# Patient Record
Sex: Male | Born: 2000 | Race: Black or African American | Hispanic: No | Marital: Single
Health system: Southern US, Community
[De-identification: ages and names within clinical notes are randomized; demographics above are authoritative.]

## PROBLEM LIST (undated history)

## (undated) DIAGNOSIS — F32A Depression, unspecified: Secondary | ICD-10-CM

## (undated) DIAGNOSIS — T7840XA Allergy, unspecified, initial encounter: Secondary | ICD-10-CM

## (undated) DIAGNOSIS — R51 Headache: Secondary | ICD-10-CM

## (undated) DIAGNOSIS — R269 Unspecified abnormalities of gait and mobility: Secondary | ICD-10-CM

## (undated) DIAGNOSIS — G809 Cerebral palsy, unspecified: Secondary | ICD-10-CM

## (undated) DIAGNOSIS — F419 Anxiety disorder, unspecified: Secondary | ICD-10-CM

## (undated) HISTORY — DX: Anxiety disorder, unspecified: F41.9

## (undated) HISTORY — DX: Headache: R51

## (undated) HISTORY — PX: CIRCUMCISION: SUR203

## (undated) HISTORY — PX: EYE SURGERY: SHX253

## (undated) HISTORY — DX: Allergy, unspecified, initial encounter: T78.40XA

## (undated) HISTORY — DX: Depression, unspecified: F32.A

## (undated) HISTORY — DX: Unspecified abnormalities of gait and mobility: R26.9

---
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
## 2018-02-02 ENCOUNTER — Ambulatory Visit (INDEPENDENT_AMBULATORY_CARE_PROVIDER_SITE_OTHER): Payer: Medicaid Other | Admitting: Pediatrics

## 2018-02-02 ENCOUNTER — Encounter (INDEPENDENT_AMBULATORY_CARE_PROVIDER_SITE_OTHER): Payer: Self-pay | Admitting: Pediatrics

## 2018-02-02 VITALS — BP 112/84 | HR 68 | Ht 69.0 in | Wt 298.4 lb

## 2018-02-02 DIAGNOSIS — G808 Other cerebral palsy: Secondary | ICD-10-CM

## 2018-02-02 DIAGNOSIS — H53001 Unspecified amblyopia, right eye: Secondary | ICD-10-CM | POA: Diagnosis not present

## 2018-02-02 DIAGNOSIS — G44219 Episodic tension-type headache, not intractable: Secondary | ICD-10-CM

## 2018-02-02 DIAGNOSIS — F819 Developmental disorder of scholastic skills, unspecified: Secondary | ICD-10-CM

## 2018-02-02 NOTE — Progress Notes (Signed)
Well  Patient: Charles Bass MRN: [id number] Sex: male DOB: 23-Sep-2000  Provider: Ellison Carwin, MD Location of Care: [facility]  Note type: Routine return visit  History of Present Illness: Referral Source: Dr. Jolaine Click History from: mother, patient and [facility] chart Chief Complaint: Headaches/Cerebral Palsy  Charles Bass is a 17 y.o. male who was evaluated on Feb 02, 2018 for the first time since August 19, 2016.  Clarkson came today with his mother with a number of concerns.  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  He has a history of frequent headaches.  His mother had concerns about memory loss in December 2017.  At that time, he was in the 11th grade at [employer] taking regular classes and hopes to go to [employer].  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  There is some entrance examination that he has to take.  [name] believes that he is having problems with short and long-term memory loss, which is frightening and annoying him.  Mother requested that I provide a letter to the school asking for accommodations.    I told her that it was not that easy.  I would need to have data from the last time he had psychologic testing for school and that might be old enough that it would be rejected.    Mother says that he is having staring spells, but when I asked if he was unresponsive, she said that as soon as she gets in his face, he makes eye contact, which suggest to me that he is not having seizures.  He is also having fairly frequent headaches that seem nondescript and more tension-type in nature than not.  Mother wanted to have an MRI scan to evaluate all these  issues.  Review of Systems: A complete review of systems was remarkable for patient is experiencing an increase in memeory loss, he has headaches every day, needs a letter of accomodation for school, possible MRI, all other systems reviewed and negative.  Past Medical History Diagnosis Date  . CP (cerebral palsy) (HCC)   . Headache(784.0)    Hospitalizations: No., Head Injury: No., Nervous System Infections: No., Immunizations up to date: Yes.    Birth History 2 lbs. 9 oz. infant born at [redacted] weeks gestational age to a 19 year old g 5 p 3 0 1 3 male. Gestation was complicated by intra-uterine growth retardation Mother received Epidural anesthesia  primary cesarean section Nursery Course was complicated by prolonged hospitalization due to prematurity Growth and Development was recalled as global delays  Behavior History self-injurious behavior, withdrawal  Surgical History Procedure Laterality Date  . CIRCUMCISION  2000  . EYE SURGERY  2000   Several eye surgeries when he was an infant   Family History family history includes Cancer in his maternal grandmother; Heart attack in his maternal grandfather. Family history is negative for migraines, seizures, intellectual disabilities, blindness, deafness, birth defects, chromosomal disorder, or autism.  Social History Socioeconomic History  . Marital status: Single  . Years of education: 58  . Highest education level: High School  Occupational History  . Unemployed  Social Needs  . Financial resource strain: Not on file  . Food insecurity:    Worry:  Not on file    Inability: Not on file  . Transportation needs:    Medical: Not on file    Non-medical: Not on file  Tobacco Use  . Smoking status: Passive Smoke Exposure - Never Smoker  . Smokeless tobacco: Never Used  Substance and Sexual Activity  . Alcohol use: No    Alcohol/week: 0.0 oz  . Drug use: No  . 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 He has four siblings.     He enjoys rapping, school and computer.   No Known Allergies  Physical Exam BP 112/84   Pulse 68   Ht  (1.753 m)   Wt 298 lb 6.4 oz (135.4 kg)   BMI 44.07 kg/m   General: alert, well developed, morbidly obese, in no acute distress, black hair, brown eyes, left handed Head: normocephalic, no dysmorphic features Ears, Nose and Throat: Otoscopic: tympanic membranes normal; pharynx: oropharynx is pink without exudates or tonsillar hypertrophy Neck: supple, full range of motion, no cranial or cervical bruits Respiratory: auscultation clear Cardiovascular: no murmurs, pulses are normal Musculoskeletal: no skeletal deformities or apparent scoliosis Skin: no rashes or neurocutaneous lesions  Neurologic Exam  Mental Status: alert; oriented to person, place and year; knowledge is normal for age; language is normal; Mini-Mental status examination 30/30, clock drawing 5/5, animal naming 17 in 1 minute all of which are normal Cranial Nerves: visual fields are full to double simultaneous stimuli; extraocular movements are full and conjugate; pupils are round reactive to light; funduscopic examination shows sharp disc margins with normal vessels; symmetric facial strength; midline tongue and uvula; air conduction is greater than bone conduction bilaterally Motor: Normal strength, tone and mass; good fine motor movements; no pronator drift Sensory: intact responses to cold, vibration, proprioception and stereognosis Coordination: good finger-to-nose, rapid repetitive alternating movements and finger apposition Gait and Station: shuffling, broad-based gait and station: patient is not able to walk on heels, walks on his toes and tandem difficulty; balance is fair; Romberg exam is negative Reflexes: symmetric and diminished bilaterally; no clonus;  bilateral flexor plantar responses  Assessment 1. 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 2. Amblyopia, right eye, H53.001. 3. Problems with learning, F81.9. 4. Morbid obesity, E66.01. 5. Episodic tension-type headache, not intractable, G44.219.  Discussion I assessed Braylen carefully, not only general examination but performed a detailed mini-mental status examination, he did extremely well.  He scored 30/30, accurately drew a clock face and put the proper time in it and was able to name 17 animals in 60 seconds, which is average.  I see no evidence of cognitive deterioration in this young man.  I believe that he has struggled in school previously and that this is not a new problem based on my notes a year and a half ago.  He and his mother live in [place], but they commute back and forth between [place] and [place] because there is family here.  He graduated from [employer] in 2018.  Recently because of pain in his legs, he has been given hydrocodone which he takes about once a day, rarely more often.  He also takes baclofen and a muscle relaxer.  I do not have the information about these medications.  Plan I asked him to keep a daily prospective headache calendar.  I reassured him about his cognitive capabilities based  on his normal mental status exam.  I think that college is going to be difficult for him.  He needed some modifications when he was at [employer].  I see no reason to perform an MRI scan at this time based on a normal examination and normal mental status exam.  He seems otherwise stable to me today.  I counseled Dozier and his mother that school is going to be somewhat difficult for him.  I told them that if they provided the request for a letter and any information that they could concern in previous testing that he had, that I would be happy to help secure additional assistance at school.  I emphasized to mother more than once that I needed to have IQ and achievement  testing scores and that they needed to be most recent.  I asked mother to bring in his last IEP that will help me understand how he was doing when he graduated from [employer] over a year ago.  I spent 40 minutes of face-to-face time with Tranquilino and his mother, more than half of it in consultation, discussing her concerns over his cognitive abilities, his staring spells, his headaches, and performing a detailed physical and mental status examination.  Doing exactly what I wanted to feel like you are living is being very engaged   Medication List    Accurate as of 02/02/18 11:59 PM.      cetirizine 10 MG tablet Commonly known as:  ZYRTEC Reported on 01/13/2016   doxycycline 100 MG capsule Commonly known as:  VIBRAMYCIN Take 1 capsule (100 mg total) by mouth 2 (two) times daily.   HYDROcodone-acetaminophen 5-325 MG tablet Commonly known as:  NORCO/VICODIN Take 1 tablet by mouth every 12 (twelve) hours as needed. for pain    1 to you make the storm in [place] right now about the medication list was reviewed and reconciled. All changes or newly prescribed medications were explained.  A complete medication list was provided to the patient/caregiver.  Deetta Perla MD

## 2018-02-02 NOTE — Patient Instructions (Addendum)
Chest is has had problems with learning for some time.  Going to college is going to be an extra stressful situation.  I will get him over carefully today and I do not find any problems with his memory, attention span, recall, or language.  I will be happy to write a letter on his behalf to request accommodations but I have to have some detailed information about his IQ and achievement testing.  I do not know when this was last done.  Please get the paper to me that request this letter.  Also get to me his last IEP.  I will do what I can.  I will tell you if I am not able to do a good job.  I do not think that he needs an MRI scan based on my assessment today.

## 2018-04-13 ENCOUNTER — Telehealth (INDEPENDENT_AMBULATORY_CARE_PROVIDER_SITE_OTHER): Payer: Self-pay | Admitting: Pediatrics

## 2018-04-13 NOTE — Telephone Encounter (Signed)
°  Who's calling (name and relationship to patient) : Mom/Sangria  Best contact number:  Hm # [phone] or mobile # [phone]  Provider they see: Dr Sharene SkeansHickling  Reason for call: Mom called in requesting a call back, stated that pt needs to have an adult IQ  psychological test and she is not sure where he could have it done; she would like for Dr Sharene SkeansHickling to recommend a place where he may be able to have it done since he will be needing it in order to continue to receive SSI.

## 2018-04-14 NOTE — Telephone Encounter (Signed)
I called patient's mother and lvm letting her know that we had received her message and would give her a call back once we receive recommendations from Dr. Sharene SkeansHickling.

## 2018-04-15 NOTE — Telephone Encounter (Signed)
I called patient's mother and let her know of Dr. Darl HouseholderHickling's message. Mother verbalized understanding and would like Dr. Sharene SkeansHickling to call her if he finds someone that may be able to see [facility].

## 2018-04-15 NOTE — Telephone Encounter (Signed)
Trying to find an adult psychologist who takes Medicaid is very difficult.  I am not certain where to start.  May have to start with the system psychologist and find out what other practitioners in the community see patients on Medicaid.  I do not think that any of the [facility] psychologists do

## 2018-05-02 NOTE — Telephone Encounter (Signed)
Please mail or fax this to home.

## 2018-05-02 NOTE — Telephone Encounter (Signed)
Spoke with mom to see how should would like to receive the list. She wanted me to mail it to the address on file. It has been placed up front to be mailed

## 2018-06-23 IMAGING — DX DG ABDOMEN 1V
2 series · 2 of 2 positions shown · non-contrast
Comparison: No recent prior.

CLINICAL DATA: Abdominal pain.

EXAM:
ABDOMEN - 1 VIEW

[dg abd 1 view (1 of 2)]
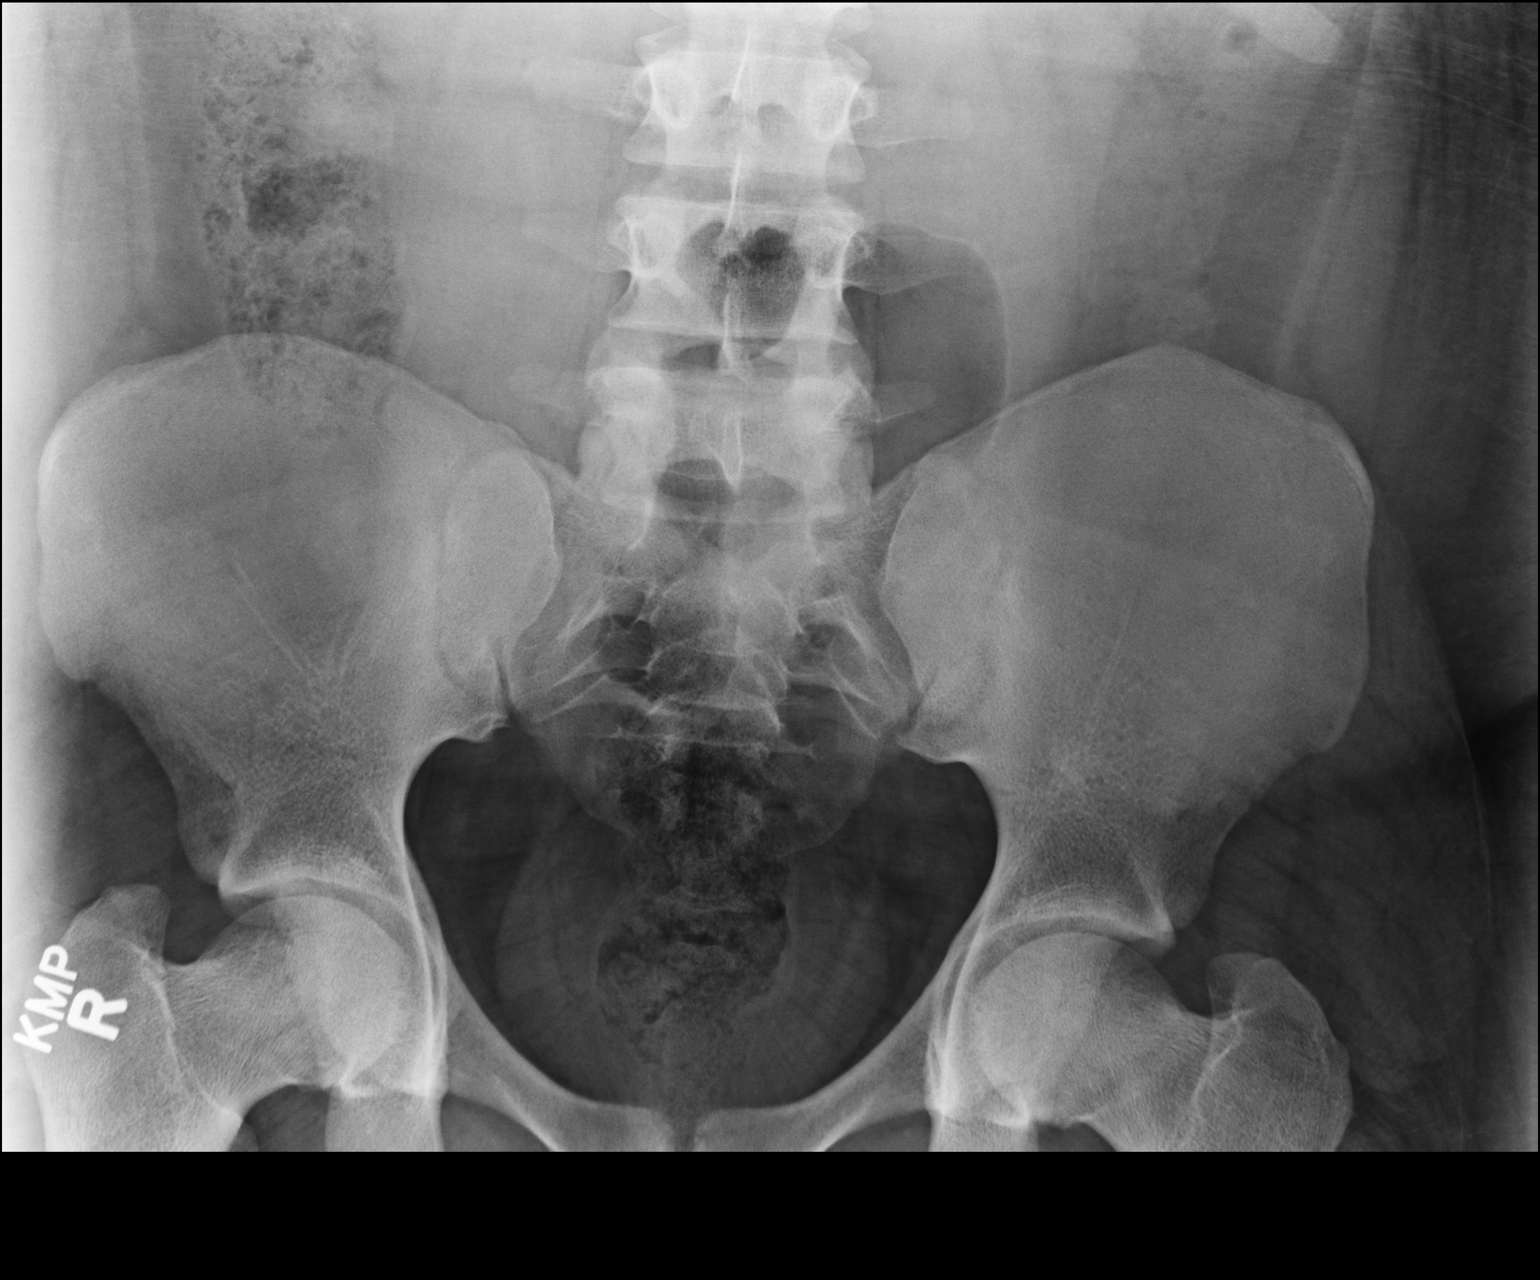

[dg abd 1 view (2 of 2)]
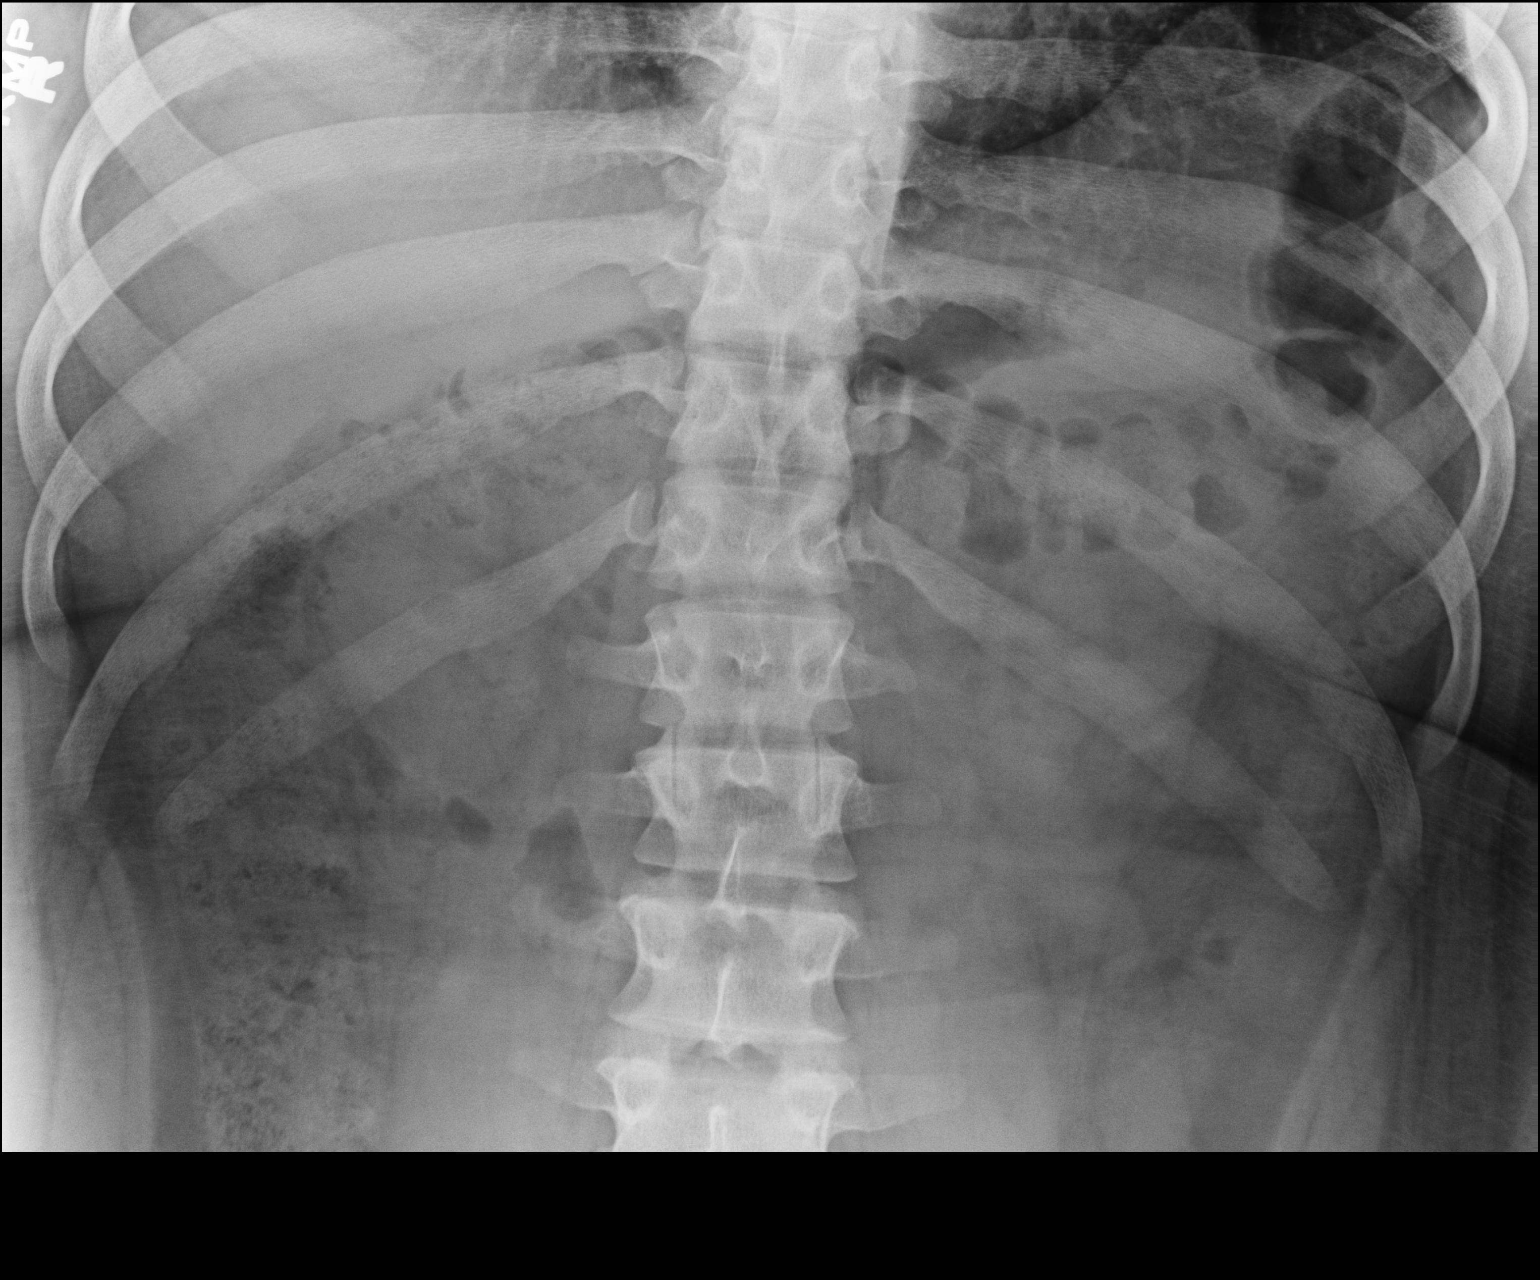

[2 of 2 positions shown; findings below may reference images not displayed]

FINDINGS: Soft tissue structures are unremarkable. No bowel distention. No
free air. Moderate stool volume. No pathologic intra-abdominal
calcifications. No acute bony abnormality .
IMPRESSION: No acute abnormality identified.

## 2018-09-14 ENCOUNTER — Ambulatory Visit (INDEPENDENT_AMBULATORY_CARE_PROVIDER_SITE_OTHER): Payer: Medicaid Other | Admitting: Pediatrics

## 2019-08-16 ENCOUNTER — Ambulatory Visit: Payer: Medicaid Other | Admitting: Family Medicine

## 2020-08-16 ENCOUNTER — Ambulatory Visit: Payer: Medicaid Other | Admitting: Family Medicine

## 2020-09-25 ENCOUNTER — Telehealth: Payer: Self-pay | Admitting: Family Medicine

## 2020-09-25 ENCOUNTER — Ambulatory Visit: Payer: Medicaid Other | Admitting: Family Medicine

## 2020-09-25 NOTE — Telephone Encounter (Signed)
Noted. Patient will follow up for initial visit with our office on 10/11/2020.

## 2020-09-25 NOTE — Telephone Encounter (Signed)
Called LVM with Father to inform Pt to call the office with his new phone number

## 2020-10-11 ENCOUNTER — Other Ambulatory Visit: Payer: Self-pay

## 2020-10-11 ENCOUNTER — Ambulatory Visit (INDEPENDENT_AMBULATORY_CARE_PROVIDER_SITE_OTHER): Payer: Medicaid Other | Admitting: Family Medicine

## 2020-10-11 ENCOUNTER — Encounter: Payer: Self-pay | Admitting: Family Medicine

## 2020-10-11 VITALS — BP 139/64 | HR 86 | Temp 98.4°F | Ht 69.0 in | Wt 282.0 lb

## 2020-10-11 DIAGNOSIS — Z7689 Persons encountering health services in other specified circumstances: Secondary | ICD-10-CM | POA: Diagnosis not present

## 2020-10-11 DIAGNOSIS — Z9189 Other specified personal risk factors, not elsewhere classified: Secondary | ICD-10-CM | POA: Diagnosis not present

## 2020-10-11 DIAGNOSIS — G809 Cerebral palsy, unspecified: Secondary | ICD-10-CM | POA: Diagnosis not present

## 2020-10-11 DIAGNOSIS — Z0000 Encounter for general adult medical examination without abnormal findings: Secondary | ICD-10-CM

## 2020-10-11 DIAGNOSIS — Z789 Other specified health status: Secondary | ICD-10-CM

## 2020-10-11 DIAGNOSIS — R52 Pain, unspecified: Secondary | ICD-10-CM

## 2020-10-11 DIAGNOSIS — Z09 Encounter for follow-up examination after completed treatment for conditions other than malignant neoplasm: Secondary | ICD-10-CM

## 2020-10-11 NOTE — Progress Notes (Signed)
Patient [facility]   New Patient--Establish Care   Subjective:  Patient ID: Charles Bass, male    DOB: 10-Nov-2000  Age: 21 y.o. MRN: [id number]  CC:  Chief Complaint  Patient presents with  . New Patient (Initial Visit)    New patient , est care     HPI Charles Bass is a 20 year old male who presents to Establish Care today.    Patient Active Problem List   Diagnosis Date Noted  . Amblyopia, right eye 08/19/2016  . Morbid obesity ([place]) 03/27/2015  . Episodic tension-type headache, not intractable 03/26/2015  . Problems with learning 03/26/2015  . Migraine without aura 12/01/2013  . 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 Lack of normal physiological development, unspecified 12/01/2013  . 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 Attention deficit disorder 12/01/2013  . Diplegic infantile cerebral palsy ([place]) 12/01/2013   Current Status: This will be Charles Bass initial office visit with me. He was previously seeing Dr. Oneita Kras, MD for his PCP needs. He has been back in [place] for 2 years. Since his last office visit, he has c/o intermittent head pain, which last a maximum of 30 minutes. He states that these headaches usually happen in evening time. He is currently working evening hours part-time. He states that he had these headaches more frequently when he was younger. He is currently followed by Dr. Ivy Lynn Dakwa @ [facility] on [address] in [place] for Pain Management of Cerebral Palsy. He is not seeing specialist at this time for CP. He reports occasional lower extremity weakness. He denies fevers, chills, fatigue, recent infections, weight loss, and night sweats. He has not had any headaches, visual changes, dizziness, and falls. No chest pain, heart palpitations, cough and shortness of breath reported. No reports of GI problems such as nausea, vomiting, diarrhea, and  constipation. He has no reports of blood in stools, dysuria and hematuria. No depression or anxiety. He is taking all medications as prescribed. He denies pain today.   Past Medical History:  Diagnosis Date  . Allergy   . CP (cerebral palsy) ([place])   . ONGEXBMW(413.2)     Past Surgical History:  Procedure Laterality Date  . CIRCUMCISION  2000  . EYE SURGERY  2000   Several eye surgeries when he was an infant    Family History  Problem Relation Age of Onset  . Heart attack Maternal Grandfather        Died at age 40  . Cancer Maternal Grandmother        Died at 51    Social History   Socioeconomic History  . Marital status: Single    Spouse name: Not on file  . Number of children: Not on file  . Years of education: Not on file  . Highest education level: Not on file  Occupational History  . Not on file  Tobacco Use  . Smoking status: Passive Smoke Exposure - Never Smoker  . Smokeless tobacco: Never Used  Vaping Use  . Vaping Use: Never used  Substance and Sexual Activity  . Alcohol use: No    Alcohol/week: 0.0 standard drinks  . Drug use: No  . Sexual activity: Never  Other Topics Concern  . 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 He  has four siblings.    He enjoys rapping, school and computer.   Social Determinants of Health   Financial Resource Strain: Not on file  Food Insecurity: Not on file  Transportation Needs: Not on file  Physical Activity: Not on file  Stress: Not on file  Social Connections: Not on file  Intimate Partner Violence: Not on file    Outpatient Medications Prior to Visit  Medication Sig Dispense Refill  . HYDROcodone-acetaminophen (NORCO/VICODIN) 5-325 MG tablet Take 1 tablet by mouth every 12 (twelve) hours as needed. for pain  0  . cetirizine (ZYRTEC) 10 MG tablet Reported on 01/13/2016 (Patient not taking: No sig  reported)    . doxycycline (VIBRAMYCIN) 100 MG capsule Take 1 capsule (100 mg total) by mouth 2 (two) times daily. 20 capsule 0   No facility-administered medications prior to visit.    No Known Allergies  ROS Review of Systems  Constitutional: Negative.   HENT: Negative.   Eyes: Negative.   Respiratory: Negative.   Cardiovascular: Negative.   Gastrointestinal: Negative.   Endocrine: Negative.   Genitourinary: Negative.   Musculoskeletal: Negative.   Skin: Negative.   Allergic/Immunologic: Negative.   Neurological: Negative.   Hematological: Negative.   Psychiatric/Behavioral: Negative.     Objective:    Physical Exam Vitals and nursing note reviewed.  Constitutional:      Appearance: Normal appearance.  HENT:     Head: Normocephalic and atraumatic.     Nose: Nose normal.     Mouth/Throat:     Mouth: Mucous membranes are moist.     Pharynx: Oropharynx is clear.  Cardiovascular:     Rate and Rhythm: Normal rate and regular rhythm.     Pulses: Normal pulses.     Heart sounds: Normal heart sounds.  Pulmonary:     Effort: Pulmonary effort is normal.     Breath sounds: Normal breath sounds.  Abdominal:     General: Bowel sounds are normal.     Palpations: Abdomen is soft.  Musculoskeletal:     Cervical back: Normal range of motion and neck supple.     Comments: Minor limited ROM noted  Skin:    General: Skin is warm and dry.  Neurological:     General: No focal deficit present.     Mental Status: He is alert and oriented to person, place, and time.     Motor: Weakness (minimal) present.     Gait: Gait abnormal (occasional ).  Psychiatric:        Mood and Affect: Mood normal.        Behavior: Behavior normal.        Thought Content: Thought content normal.        Judgment: Judgment normal.     BP 139/64 (BP Location: Left Arm, Patient Position: Sitting, Cuff Size: Large)   Pulse 86   Temp 98.4 F (36.9 C) (Temporal)   Ht '5\' 9"'  (1.753 m)   Wt 282 lb  (127.9 kg)   SpO2 99%   BMI 41.64 kg/m  Wt Readings from Last 3 Encounters:  10/11/20 282 lb (127.9 kg)  02/02/18 298 lb 6.4 oz (135.4 kg) (>99 %, Z= 3.03)*  12/22/16 283 lb 3.2 oz (128.5 kg) (>99 %, Z= 2.89)*   * Growth percentiles are based on CDC (Boys, 2-20 Years) data.     Health Maintenance Due  Topic Date Due  . Hepatitis C Screening  Never done  . COVID-19 Vaccine (1) Never done  .  HIV Screening  Never done  . TETANUS/TDAP  Never done  . INFLUENZA VACCINE  Never done    There are no preventive care reminders to display for this patient.  No results found for: TSH No results found for: WBC, HGB, HCT, MCV, PLT No results found for: NA, K, CHLORIDE, CO2, GLUCOSE, BUN, CREATININE, BILITOT, ALKPHOS, AST, ALT, PROT, ALBUMIN, CALCIUM, ANIONGAP, EGFR, GFR No results found for: CHOL No results found for: HDL No results found for: LDLCALC No results found for: TRIG No results found for: CHOLHDL No results found for: HGBA1C    Assessment & Plan:   1. Encounter to establish care  2. Cerebral palsy, unspecified type [facility]) Future referral to Neurology specialist. Patient declines at this time, but will consider.   3. Under care of pain management specialist [facility].   4. At risk for infertility - Testosterone  5. Generalized pain  6. Healthcare maintenance - CBC with Differential - Comprehensive metabolic panel - TSH - Vitamin D, 25-hydroxy - Vitamin B12 - Lipid Panel  7. Follow up He will follow up in 1 month.   No orders of the defined types were placed in this encounter.   Orders Placed This Encounter  Procedures  . CBC with Differential  . Comprehensive metabolic panel  . TSH  . Vitamin D, 25-hydroxy  . Vitamin B12  . Testosterone  . Lipid Panel    Referral Orders  No referral(s) requested today    Kathe Becton, MSN, ANE, FNP-BC [facility] [place] [address] [place],  [address] [phone]- fax  Problem List Items Addressed This Visit   None   Visit Diagnoses    Encounter to establish care    -  Primary   Cerebral palsy, unspecified type ([place])       Under care of pain management specialist       At risk for infertility       Relevant Orders   Testosterone   Generalized pain       Healthcare maintenance       Relevant Orders   CBC with Differential   Comprehensive metabolic panel   TSH   Vitamin D, 25-hydroxy   Vitamin B12   Lipid Panel   Follow up          No orders of the defined types were placed in this encounter.   Follow-up: No follow-ups on file.    Azzie Glatter, FNP

## 2020-10-12 LAB — COMPREHENSIVE METABOLIC PANEL
ALT: 12 IU/L (ref 0–44)
AST: 16 IU/L (ref 0–40)
Albumin/Globulin Ratio: 1.7 (ref 1.2–2.2)
Albumin: 4.8 g/dL (ref 4.1–5.2)
Alkaline Phosphatase: 139 IU/L — ABNORMAL HIGH (ref 44–121)
BUN/Creatinine Ratio: 8 — ABNORMAL LOW (ref 9–20)
BUN: 8 mg/dL (ref 6–20)
Bilirubin Total: 0.4 mg/dL (ref 0.0–1.2)
CO2: 25 mmol/L (ref 20–29)
Calcium: 9.9 mg/dL (ref 8.7–10.2)
Chloride: 101 mmol/L (ref 96–106)
Creatinine, Ser: 0.96 mg/dL (ref 0.76–1.27)
GFR calc Af Amer: 130 mL/min/{1.73_m2} (ref >59)
GFR calc non Af Amer: 113 mL/min/{1.73_m2} (ref >59)
Globulin, Total: 2.8 g/dL (ref 1.5–4.5)
Glucose: 75 mg/dL (ref 65–99)
Potassium: 4.5 mmol/L (ref 3.5–5.2)
Sodium: 141 mmol/L (ref 134–144)
Total Protein: 7.6 g/dL (ref 6.0–8.5)

## 2020-10-12 LAB — LIPID PANEL
Chol/HDL Ratio: 5.5 ratio — ABNORMAL HIGH (ref 0.0–5.0)
Cholesterol, Total: 193 mg/dL (ref 100–199)
HDL: 35 mg/dL — ABNORMAL LOW (ref >39)
LDL Chol Calc (NIH): 126 mg/dL — ABNORMAL HIGH (ref 0–99)
Triglycerides: 179 mg/dL — ABNORMAL HIGH (ref 0–149)
VLDL Cholesterol Cal: 32 mg/dL (ref 5–40)

## 2020-10-12 LAB — VITAMIN D 25 HYDROXY (VIT D DEFICIENCY, FRACTURES): Vit D, 25-Hydroxy: 11.6 ng/mL — ABNORMAL LOW (ref 30.0–100.0)

## 2020-10-12 LAB — CBC WITH DIFFERENTIAL/PLATELET
Basophils Absolute: 0 10*3/uL (ref 0.0–0.2)
Basos: 1 %
EOS (ABSOLUTE): 0.1 10*3/uL (ref 0.0–0.4)
Eos: 1 %
Hematocrit: 47.5 % (ref 37.5–51.0)
Hemoglobin: 15.5 g/dL (ref 13.0–17.7)
Immature Grans (Abs): 0 10*3/uL (ref 0.0–0.1)
Immature Granulocytes: 0 %
Lymphocytes Absolute: 1 10*3/uL (ref 0.7–3.1)
Lymphs: 27 %
MCH: 24 pg — ABNORMAL LOW (ref 26.6–33.0)
MCHC: 32.6 g/dL (ref 31.5–35.7)
MCV: 73 fL — ABNORMAL LOW (ref 79–97)
Monocytes Absolute: 0.4 10*3/uL (ref 0.1–0.9)
Monocytes: 10 %
Neutrophils Absolute: 2.3 10*3/uL (ref 1.4–7.0)
Neutrophils: 61 %
Platelets: 215 10*3/uL (ref 150–450)
RBC: 6.47 x10E6/uL — ABNORMAL HIGH (ref 4.14–5.80)
RDW: 15.1 % (ref 11.6–15.4)
WBC: 3.8 10*3/uL (ref 3.4–10.8)

## 2020-10-12 LAB — VITAMIN B12: Vitamin B-12: 303 pg/mL (ref 232–1245)

## 2020-10-12 LAB — TESTOSTERONE: Testosterone: 258 ng/dL — ABNORMAL LOW (ref 264–916)

## 2020-10-12 LAB — TSH: TSH: 1.46 u[IU]/mL (ref 0.450–4.500)

## 2020-10-14 ENCOUNTER — Other Ambulatory Visit: Payer: Self-pay | Admitting: Family Medicine

## 2020-10-14 ENCOUNTER — Encounter: Payer: Self-pay | Admitting: Family Medicine

## 2020-10-15 ENCOUNTER — Other Ambulatory Visit: Payer: Self-pay | Admitting: Family Medicine

## 2020-10-15 DIAGNOSIS — E559 Vitamin D deficiency, unspecified: Secondary | ICD-10-CM

## 2020-10-15 MED ORDER — VITAMIN D (ERGOCALCIFEROL) 1.25 MG (50000 UNIT) PO CAPS
50000.0000 [IU] | ORAL_CAPSULE | ORAL | 0 refills | Status: AC
Start: 2020-10-15 — End: ?

## 2020-11-04 ENCOUNTER — Other Ambulatory Visit: Payer: Self-pay

## 2020-11-04 ENCOUNTER — Ambulatory Visit (INDEPENDENT_AMBULATORY_CARE_PROVIDER_SITE_OTHER): Payer: Medicaid Other | Admitting: Family Medicine

## 2020-11-04 ENCOUNTER — Encounter: Payer: Self-pay | Admitting: Family Medicine

## 2020-11-04 VITALS — BP 134/77 | HR 82 | Temp 98.7°F | Ht 69.0 in | Wt 281.0 lb

## 2020-11-04 DIAGNOSIS — G809 Cerebral palsy, unspecified: Secondary | ICD-10-CM | POA: Diagnosis not present

## 2020-11-04 DIAGNOSIS — Z9189 Other specified personal risk factors, not elsewhere classified: Secondary | ICD-10-CM

## 2020-11-04 DIAGNOSIS — M6281 Muscle weakness (generalized): Secondary | ICD-10-CM | POA: Diagnosis not present

## 2020-11-04 DIAGNOSIS — Z789 Other specified health status: Secondary | ICD-10-CM

## 2020-11-04 DIAGNOSIS — E559 Vitamin D deficiency, unspecified: Secondary | ICD-10-CM

## 2020-11-04 DIAGNOSIS — R52 Pain, unspecified: Secondary | ICD-10-CM

## 2020-11-04 DIAGNOSIS — R269 Unspecified abnormalities of gait and mobility: Secondary | ICD-10-CM

## 2020-11-04 DIAGNOSIS — F419 Anxiety disorder, unspecified: Secondary | ICD-10-CM

## 2020-11-04 DIAGNOSIS — K5900 Constipation, unspecified: Secondary | ICD-10-CM

## 2020-11-04 DIAGNOSIS — F32A Depression, unspecified: Secondary | ICD-10-CM

## 2020-11-04 DIAGNOSIS — Z09 Encounter for follow-up examination after completed treatment for conditions other than malignant neoplasm: Secondary | ICD-10-CM

## 2020-11-04 NOTE — Progress Notes (Signed)
[facility]   Established Patient Office Visit  Subjective:  Patient ID: Charles Bass, male    DOB: 2001/07/27  Age: 20 y.o. MRN: [id number]  CC:  Chief Complaint  Patient presents with  . Follow-up    2 week follow up    HPI Charles Bass is a 20 year old male who presents for Follow Up today.   Patient Active Problem List   Diagnosis Date Noted  . Amblyopia, right eye 08/19/2016  . Morbid obesity (HCC) 03/27/2015  . Episodic tension-type headache, not intractable 03/26/2015  . Problems with learning 03/26/2015  . Migraine without aura 12/01/2013  . 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 Lack of normal physiological development, unspecified 12/01/2013  . 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 Attention deficit disorder 12/01/2013  . Diplegic infantile cerebral palsy (HCC) 12/01/2013   Current Status: Since his last office visit, he has c/o constipation often now. Denies GI problems such as nausea, vomiting, diarrhea, and constipation. She has no reports of blood in stools, dysuria and hematuria. He states that his anxiety has increased lately. He states that he has been having intermittent episodes of depression. He states that he was previously followed by Psychiatry while living in [place]. She denies fevers, chills, fatigue, recent infections, weight loss, and night sweats. She has not had any headaches, visual changes, dizziness, and falls. No chest pain, heart palpitations, cough and shortness of breath reported. She is taking all medications as prescribed. She denies pain today. His mother is on phone during telephone visit.   Past Medical History:  Diagnosis Date  . Allergy   . Anxiety and depression   . CP (cerebral palsy) (HCC)   . LZJQBHAL(937.9)     Past Surgical History:  Procedure Laterality Date  . CIRCUMCISION  2000  . EYE SURGERY  2000   Several eye surgeries  when he was an infant    Family History  Problem Relation Age of Onset  . Heart attack Maternal Grandfather        Died at age 32  . Cancer Maternal Grandmother        Died at 85    Social History   Socioeconomic History  . Marital status: Single    Spouse name: Not on file  . Number of children: Not on file  . Years of education: Not on file  . Highest education level: Not on file  Occupational History  . Not on file  Tobacco Use  . Smoking status: Passive Smoke Exposure - Never Smoker  . Smokeless tobacco: Never Used  Vaping Use  . Vaping Use: Never used  Substance and Sexual Activity  . Alcohol use: No    Alcohol/week: 0.0 standard drinks  . Drug use: No  . Sexual activity: Never  Other Topics Concern  . 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 He has four siblings.    He enjoys rapping, school and computer.   Social Determinants of Health   Financial Resource Strain: Not on file  Food Insecurity: Not on file  Transportation Needs: Not on file  Physical Activity: Not on file  Stress: Not on file  Social Connections: Not on file  Intimate Partner Violence: Not on file    Outpatient Medications Prior to Visit  Medication  Sig Dispense Refill  . HYDROcodone-acetaminophen (NORCO/VICODIN) 5-325 MG tablet Take 1 tablet by mouth every 12 (twelve) hours as needed. for pain  0  . Vitamin D, Ergocalciferol, (DRISDOL) 1.25 MG (50000 UNIT) CAPS capsule Take 1 capsule (50,000 Units total) by mouth every 7 (seven) days. 15 capsule 0   No facility-administered medications prior to visit.    No Known Allergies  ROS Review of Systems  Constitutional: Negative.   HENT: Negative.   Eyes: Negative.   Respiratory: Negative.   Cardiovascular: Negative.   Gastrointestinal: Negative.   Endocrine: Negative.   Genitourinary: Negative.   Musculoskeletal:  Positive for arthralgias (generalized) and gait problem (r/t cerebral palsy).  Skin: Negative.   Allergic/Immunologic: Negative.   Neurological: Positive for dizziness (occasional ), weakness (generalized. ) and headaches (occasional ).       Spastic cerebral palsy lower extremity  Hematological: Negative.   Psychiatric/Behavioral: Negative.    Objective:    Physical Exam Vitals and nursing note reviewed.  Constitutional:      Appearance: Normal appearance.  HENT:     Head: Normocephalic and atraumatic.     Nose: Nose normal.     Mouth/Throat:     Mouth: Mucous membranes are moist.     Pharynx: Oropharynx is clear.  Cardiovascular:     Rate and Rhythm: Normal rate and regular rhythm.     Pulses: Normal pulses.     Heart sounds: Normal heart sounds.  Pulmonary:     Effort: Pulmonary effort is normal.     Breath sounds: Normal breath sounds.  Abdominal:     General: Bowel sounds are normal.     Palpations: Abdomen is soft.  Musculoskeletal:     Cervical back: Normal range of motion and neck supple.     Comments: Generalized decreased ROM  Skin:    General: Skin is warm and dry.  Neurological:     General: No focal deficit present.     Mental Status: He is alert and oriented to person, place, and time.     Motor: Weakness (generalized ) present.     Gait: Gait abnormal (r/t cerebral palsy).  Psychiatric:        Mood and Affect: Mood normal.        Behavior: Behavior normal.        Thought Content: Thought content normal.        Judgment: Judgment normal.    BP 134/77 (BP Location: Right Arm, Patient Position: Sitting, Cuff Size: Large)   Pulse 82   Temp 98.7 F (37.1 C) (Temporal)   Ht 5\' 9"  (1.753 m)   Wt 281 lb (127.5 kg)   SpO2 98%   BMI 41.50 kg/m  Wt Readings from Last 3 Encounters:  11/04/20 281 lb (127.5 kg)  10/11/20 282 lb (127.9 kg)  02/02/18 298 lb 6.4 oz (135.4 kg) (>99 %, Z= 3.03)*   * Growth percentiles are based on CDC (Boys, 2-20 Years) data.    Health Maintenance Due  Topic Date Due  . Hepatitis C Screening  Never done  . COVID-19 Vaccine (1) Never done  . HIV Screening  Never done  . TETANUS/TDAP  Never done  . INFLUENZA VACCINE  Never done    There are no preventive care reminders to display for this patient.  Lab Results  Component Value Date   TSH 1.460 10/11/2020   Lab Results  Component Value Date   WBC 3.8 10/11/2020   HGB 15.5 10/11/2020   HCT  47.5 10/11/2020   MCV 73 (L) 10/11/2020   PLT 215 10/11/2020   Lab Results  Component Value Date   NA 141 10/11/2020   K 4.5 10/11/2020   CO2 25 10/11/2020   GLUCOSE 75 10/11/2020   BUN 8 10/11/2020   CREATININE 0.96 10/11/2020   BILITOT 0.4 10/11/2020   ALKPHOS 139 (H) 10/11/2020   AST 16 10/11/2020   ALT 12 10/11/2020   PROT 7.6 10/11/2020   ALBUMIN 4.8 10/11/2020   CALCIUM 9.9 10/11/2020   Lab Results  Component Value Date   CHOL 193 10/11/2020   Lab Results  Component Value Date   HDL 35 (L) 10/11/2020   Lab Results  Component Value Date   LDLCALC 126 (H) 10/11/2020   Lab Results  Component Value Date   TRIG 179 (H) 10/11/2020   Lab Results  Component Value Date   CHOLHDL 5.5 (H) 10/11/2020   No results found for: HGBA1C  Assessment & Plan:   1. Cerebral palsy, unspecified type [facility]) - Ambulatory referral to Neurology - Ambulatory referral to Physical Therapy  2. Under care of pain management specialist  3. Generalized pain  4. Abnormal gait due to muscle weakness - Ambulatory referral to Neurology - Ambulatory referral to Physical Therapy  5. At risk for infertility  6. Constipation, unspecified constipation type  7. Anxiety and depression - Ambulatory referral to Psychiatry  8. Vitamin D deficiency  9. Follow up He will follow up in 1 month.   No orders of the defined types were placed in this encounter.   Orders Placed This Encounter  Procedures  . Ambulatory referral to Psychiatry  . Ambulatory referral to  Neurology  . Ambulatory referral to Physical Therapy     Referral Orders     Ambulatory referral to Psychiatry     Ambulatory referral to Neurology     Ambulatory referral to Physical Therapy   Raliegh Ip, MSN, ANE, FNP-BC [facility] [address] [place], [place] [address] [phone] fax   Problem List Items Addressed This Visit   None   Visit Diagnoses    Cerebral palsy, unspecified type [facility])    -  Primary   Relevant Orders   Ambulatory referral to Neurology   Ambulatory referral to Physical Therapy   Under care of pain management specialist       Generalized pain       Abnormal gait due to muscle weakness       Relevant Orders   Ambulatory referral to Neurology   Ambulatory referral to Physical Therapy   At risk for infertility       Constipation, unspecified constipation type       Anxiety and depression       Relevant Orders   Ambulatory referral to Psychiatry   Vitamin D deficiency       Follow up          No orders of the defined types were placed in this encounter.   Follow-up: No follow-ups on file.    Kallie Locks, FNP

## 2020-11-05 ENCOUNTER — Other Ambulatory Visit: Payer: Self-pay

## 2020-11-05 DIAGNOSIS — G808 Other cerebral palsy: Secondary | ICD-10-CM

## 2020-11-05 DIAGNOSIS — G809 Cerebral palsy, unspecified: Secondary | ICD-10-CM

## 2020-11-05 DIAGNOSIS — M6281 Muscle weakness (generalized): Secondary | ICD-10-CM

## 2020-11-05 DIAGNOSIS — R269 Unspecified abnormalities of gait and mobility: Secondary | ICD-10-CM

## 2020-11-08 ENCOUNTER — Ambulatory Visit: Payer: Medicaid Other | Admitting: Family Medicine

## 2020-11-27 ENCOUNTER — Ambulatory Visit: Payer: Medicaid Other | Admitting: Physical Therapy

## 2020-12-02 ENCOUNTER — Other Ambulatory Visit: Payer: Self-pay

## 2020-12-02 ENCOUNTER — Encounter: Payer: Self-pay | Admitting: Family Medicine

## 2020-12-02 ENCOUNTER — Ambulatory Visit (INDEPENDENT_AMBULATORY_CARE_PROVIDER_SITE_OTHER): Payer: Medicaid Other | Admitting: Family Medicine

## 2020-12-02 VITALS — BP 138/70 | HR 72 | Temp 98.4°F | Ht 69.0 in | Wt 269.0 lb

## 2020-12-02 DIAGNOSIS — G809 Cerebral palsy, unspecified: Secondary | ICD-10-CM | POA: Diagnosis not present

## 2020-12-02 DIAGNOSIS — R52 Pain, unspecified: Secondary | ICD-10-CM

## 2020-12-02 DIAGNOSIS — M6281 Muscle weakness (generalized): Secondary | ICD-10-CM | POA: Diagnosis not present

## 2020-12-02 DIAGNOSIS — F32A Depression, unspecified: Secondary | ICD-10-CM

## 2020-12-02 DIAGNOSIS — Z09 Encounter for follow-up examination after completed treatment for conditions other than malignant neoplasm: Secondary | ICD-10-CM

## 2020-12-02 DIAGNOSIS — F419 Anxiety disorder, unspecified: Secondary | ICD-10-CM

## 2020-12-02 DIAGNOSIS — G808 Other cerebral palsy: Secondary | ICD-10-CM | POA: Diagnosis not present

## 2020-12-02 DIAGNOSIS — R269 Unspecified abnormalities of gait and mobility: Secondary | ICD-10-CM

## 2020-12-02 NOTE — Progress Notes (Signed)
[facility]   Established Patient Office Visit  Subjective:  Patient ID: Charles Bass, male    DOB: 02/26/2001  Age: 20 y.o. MRN: [id number]  CC:  Chief Complaint  Patient presents with  . Follow-up    Follow up 1 month , working his self, trying get stress out     HPI Charles Bass is a 20 year old male who presents for Follow Up today.   Patient Active Problem List   Diagnosis Date Noted  . Amblyopia, right eye 08/19/2016  . Morbid obesity (HCC) 03/27/2015  . Episodic tension-type headache, not intractable 03/26/2015  . Problems with learning 03/26/2015  . Migraine without aura 12/01/2013  . 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 Lack of normal physiological development, unspecified 12/01/2013  . 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 Attention deficit disorder 12/01/2013  . Diplegic infantile cerebral palsy (HCC) 12/01/2013   Current Status: Since his last office visit, he is doing well with no complaints. He continues to follow up with [facility] as needed. We previously sent order for Orthopedic shoes and insoles to [facility], r/t his history of Cerebral Palsy. His anxiety is stable today. He denies suicidal ideations, homicidal ideations, or auditory hallucinations. He denies fevers, chills, fatigue, recent infections, weight loss, and night sweats. He has not had any headaches, visual changes, dizziness, and falls. No chest pain, heart palpitations, cough and shortness of breath reported. Denies GI problems such as nausea, vomiting, diarrhea, and constipation. He has no reports of blood in stools, dysuria and hematuria. He is taking all medications as prescribed. He denies pain today.   Past Medical History:  Diagnosis Date  . Abnormal gait   . Allergy   . Anxiety and depression   . CP (cerebral palsy) (HCC)   . WHQPRFFM(384.6)     Past Surgical History:  Procedure  Laterality Date  . CIRCUMCISION  2000  . EYE SURGERY  2000   Several eye surgeries when he was an infant    Family History  Problem Relation Age of Onset  . Heart attack Maternal Grandfather        Died at age 24  . Cancer Maternal Grandmother        Died at 45    Social History   Socioeconomic History  . Marital status: Single    Spouse name: Not on file  . Number of children: Not on file  . Years of education: Not on file  . Highest education level: Not on file  Occupational History  . Not on file  Tobacco Use  . Smoking status: Passive Smoke Exposure - Never Smoker  . Smokeless tobacco: Never Used  Vaping Use  . Vaping Use: Never used  Substance and Sexual Activity  . Alcohol use: No    Alcohol/week: 0.0 standard drinks  . Drug use: No  . Sexual activity: Never  Other Topics Concern  . 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 He has four siblings.    He enjoys rapping, school and computer.   Social Determinants of Health   Financial Resource Strain: Not on file  Food Insecurity: Not on file  Transportation Needs: Not on file  Physical Activity: Not on file  Stress: Not on file  Social Connections: Not on file  Intimate Partner Violence: Not on file    Outpatient Medications Prior to Visit  Medication Sig Dispense Refill  . HYDROcodone-acetaminophen (NORCO/VICODIN) 5-325 MG tablet Take 1 tablet by mouth every 12 (twelve) hours as needed. for pain (Patient not taking: Reported on 12/02/2020)  0  . Vitamin D, Ergocalciferol, (DRISDOL) 1.25 MG (50000 UNIT) CAPS capsule Take 1 capsule (50,000 Units total) by mouth every 7 (seven) days. (Patient not taking: Reported on 12/02/2020) 15 capsule 0   No facility-administered medications prior to visit.   No Known Allergies  ROS Review of Systems  Constitutional: Negative.   HENT: Negative.    Eyes: Negative.   Respiratory: Negative.   Cardiovascular: Negative.   Gastrointestinal: Negative.   Endocrine: Negative.   Genitourinary: Negative.   Musculoskeletal: Positive for arthralgias (generalized).  Skin: Negative.   Allergic/Immunologic: Negative.   Neurological: Positive for dizziness (occasional ) and headaches (occasional ).  Hematological: Negative.   Psychiatric/Behavioral: Negative.      Objective:    Physical Exam Vitals and nursing note reviewed.  Constitutional:      Appearance: Normal appearance.  HENT:     Head: Normocephalic and atraumatic.     Nose: Nose normal.     Mouth/Throat:     Mouth: Mucous membranes are moist.     Pharynx: Oropharynx is clear.  Cardiovascular:     Rate and Rhythm: Normal rate and regular rhythm.     Pulses: Normal pulses.     Heart sounds: Normal heart sounds.  Pulmonary:     Effort: Pulmonary effort is normal.     Breath sounds: Normal breath sounds.  Abdominal:     General: Bowel sounds are normal.     Palpations: Abdomen is soft.  Musculoskeletal:     Cervical back: Normal range of motion and neck supple.     Comments: Limited ROM in extremities  Skin:    General: Skin is warm and dry.  Neurological:     Mental Status: He is alert and oriented to person, place, and time.     Motor: Weakness (r/t CP) present.     Gait: Gait abnormal (r/t Cerebral Palsy).  Psychiatric:        Mood and Affect: Mood normal.        Behavior: Behavior normal.        Thought Content: Thought content normal.        Judgment: Judgment normal.     BP 138/70 (BP Location: Left Arm, Patient Position: Sitting, Cuff Size: Normal)   Pulse 72   Temp 98.4 F (36.9 C) (Temporal)   Ht 5\' 9"  (1.753 m)   Wt 269 lb (122 kg)   SpO2 99%   BMI 39.72 kg/m  Wt Readings from Last 3 Encounters:  12/02/20 269 lb (122 kg)  11/04/20 281 lb (127.5 kg)  10/11/20 282 lb (127.9 kg)     Health Maintenance Due  Topic Date Due  . Hepatitis C  Screening  Never done  . COVID-19 Vaccine (1) Never done  . HPV VACCINES (1 - Male 2-dose series) Never done  . HIV Screening  Never done  . TETANUS/TDAP  Never done  . INFLUENZA VACCINE  Never done       Topic Date Due  . HPV VACCINES (1 - Male 2-dose series) Never done    Lab Results  Component Value Date   TSH 1.460 10/11/2020   Lab Results  Component Value Date   WBC 3.8 10/11/2020   HGB 15.5  10/11/2020   HCT 47.5 10/11/2020   MCV 73 (L) 10/11/2020   PLT 215 10/11/2020   Lab Results  Component Value Date   NA 141 10/11/2020   K 4.5 10/11/2020   CO2 25 10/11/2020   GLUCOSE 75 10/11/2020   BUN 8 10/11/2020   CREATININE 0.96 10/11/2020   BILITOT 0.4 10/11/2020   ALKPHOS 139 (H) 10/11/2020   AST 16 10/11/2020   ALT 12 10/11/2020   PROT 7.6 10/11/2020   ALBUMIN 4.8 10/11/2020   CALCIUM 9.9 10/11/2020   Lab Results  Component Value Date   CHOL 193 10/11/2020   Lab Results  Component Value Date   HDL 35 (L) 10/11/2020   Lab Results  Component Value Date   LDLCALC 126 (H) 10/11/2020   Lab Results  Component Value Date   TRIG 179 (H) 10/11/2020   Lab Results  Component Value Date   CHOLHDL 5.5 (H) 10/11/2020   No results found for: HGBA1C    Assessment & Plan:   1. Cerebral palsy, unspecified type (HCC) Re-faxed order for Orthopedic shoes and insoles to [facility], [place], [phone] (fax); [phone](office number). We also faxed last office note, demographics and contact information. Patient last seen at their office in 2017.  - Ambulatory referral to Physical Therapy  2. Diplegic infantile cerebral palsy (HCC)  3. Abnormal gait due to muscle weakness - Ambulatory referral to Physical Therapy  4. Generalized pain  5. Anxiety and depression  6. Follow up He will follow up in 3 months.    No orders of the defined types were placed in this encounter.   Orders Placed This Encounter  Procedures  . Ambulatory referral to Physical  Therapy     Referral Orders     Ambulatory referral to Physical Therapy  Raliegh Ip, MSN, ANE, FNP-BC [facility]/Internal Medicine/[facility] [address] [place], [place] [address] [phone] fax   Problem List Items Addressed This Visit      Nervous and Auditory   Diplegic infantile cerebral palsy (HCC) (Chronic)    Other Visit Diagnoses    Cerebral palsy, unspecified type (HCC)    -  Primary   Relevant Orders   Ambulatory referral to Physical Therapy   Abnormal gait due to muscle weakness       Relevant Orders   Ambulatory referral to Physical Therapy   Generalized pain       Anxiety and depression       Follow up          No orders of the defined types were placed in this encounter.   Follow-up: No follow-ups on file.    Kallie Locks, FNP

## 2020-12-04 ENCOUNTER — Encounter: Payer: Self-pay | Admitting: Family Medicine

## 2020-12-18 ENCOUNTER — Ambulatory Visit: Payer: Medicaid Other | Attending: Family Medicine | Admitting: Physical Therapy

## 2020-12-18 ENCOUNTER — Encounter: Payer: Self-pay | Admitting: Physical Therapy

## 2020-12-18 ENCOUNTER — Other Ambulatory Visit: Payer: Self-pay

## 2020-12-18 DIAGNOSIS — M545 Low back pain, unspecified: Secondary | ICD-10-CM | POA: Diagnosis not present

## 2020-12-18 DIAGNOSIS — M25552 Pain in left hip: Secondary | ICD-10-CM | POA: Insufficient documentation

## 2020-12-18 DIAGNOSIS — M25572 Pain in left ankle and joints of left foot: Secondary | ICD-10-CM | POA: Diagnosis present

## 2020-12-18 DIAGNOSIS — M6281 Muscle weakness (generalized): Secondary | ICD-10-CM | POA: Insufficient documentation

## 2020-12-18 DIAGNOSIS — R2689 Other abnormalities of gait and mobility: Secondary | ICD-10-CM

## 2020-12-18 DIAGNOSIS — M25561 Pain in right knee: Secondary | ICD-10-CM | POA: Insufficient documentation

## 2020-12-18 DIAGNOSIS — M25551 Pain in right hip: Secondary | ICD-10-CM | POA: Diagnosis present

## 2020-12-18 DIAGNOSIS — M25562 Pain in left knee: Secondary | ICD-10-CM | POA: Diagnosis present

## 2020-12-18 DIAGNOSIS — G8929 Other chronic pain: Secondary | ICD-10-CM | POA: Insufficient documentation

## 2020-12-18 DIAGNOSIS — M25571 Pain in right ankle and joints of right foot: Secondary | ICD-10-CM

## 2020-12-18 NOTE — Patient Instructions (Signed)
Access Code: [email or web address] URL: [email or web address] Date: 12/18/2020 Prepared by: Jeri Cos  Exercises Sidelying Hip Abduction - 1 x daily - 7 x weekly - 2-3 sets - 10 reps Standing Heel Raise - 1 x daily - 7 x weekly - 2-3 sets - 10 reps Supine Bridge - 1 x daily - 7 x weekly - 3 sets - 10 reps Supine Hamstring Stretch with Strap - 1 x daily - 7 x weekly - 3 sets - 30 seconds hold

## 2020-12-18 NOTE — Therapy (Addendum)
[place], [place], [address] Phone: [phone]   Fax:  [phone]  Physical Therapy Evaluation and Discharge  Patient Details  Name: Charles Bass MRN: [id number] Date of Birth: [birth year] Referring Provider (PT): Azzie Glatter, FNP    PHYSICAL THERAPY DISCHARGE SUMMARY  Visits from Start of Care: 1  Current functional level related to goals / functional outcomes: See below   Remaining deficits: Has not been seen since initial eval   Education / Equipment: See below  Plan: Patient agrees to discharge.  Patient goals were not met. Patient is being discharged due to not returning since the last visit.  ?????       Encounter Date: 12/18/2020   PT End of Session - 12/18/20 1020     Visit Number 1    Number of Visits 8    Date for PT Re-Evaluation 02/12/21    Authorization Type MCD    PT Start Time 1007    PT Stop Time 1049    PT Time Calculation (min) 42 min    Activity Tolerance Patient tolerated treatment well    Behavior During Therapy WFL for tasks assessed/performed             Past Medical History:  Diagnosis Date  . Abnormal gait   . Allergy   . Anxiety and depression   . CP (cerebral palsy) ([place])   . QPYPPJKD(326.7)     Past Surgical History:  Procedure Laterality Date  . CIRCUMCISION  2000  . EYE SURGERY  2000   Several eye surgeries when he was an infant    There were no vitals filed for this visit.    Subjective Assessment - 12/18/20 1008     Subjective I have been having more back, leg, ankle, and knee pain (all bilaterally) more. I think I just need to start doing physical therapy again, I've been doing physical therapy most of my life due to my CP. A couple of days ago, my R knee 'popped out of place' when I was playing with some kids and it has been hurting since. My knees chronically pop out of place. My back can hurt at any moment and same with  my knees. My ankles usually hurt at night at the end of a long day.    Pertinent History CP, abnormal gait, anxiety, depression    Limitations Walking;House hold activities;Standing;Lifting    How long can you sit comfortably? 7 minutes    How long can you stand comfortably? 7 minutes    How long can you walk comfortably? 1 hour 30 minutes    Patient Stated Goals to feel better    Currently in Pain? Yes    Pain Score 4     Pain Location --   R knee tightness   Pain Orientation Right    Pain Descriptors / Indicators Tightness    Pain Type Chronic pain    Pain Onset More than a month ago    Pain Frequency Intermittent    Multiple Pain Sites Yes    Pain Score 5    Pain Location Back    Pain Orientation Lower;Medial    Pain Descriptors / Indicators Tightness;Aching    Pain Type Chronic pain    Pain Onset More than a month ago    Pain Frequency Intermittent                  [facility] PT Assessment - 12/18/20  0001       Assessment   Medical Diagnosis Cerebral palsy, unspecified type; abnormal gait due to muscle weakness    Referring Provider (PT) Azzie Glatter, FNP    Onset Date/Surgical Date --   since birth, got worse in the past couple of months   Prior Therapy 2017      Precautions   Precautions None      Restrictions   Weight Bearing Restrictions No      Balance Screen   Has the patient fallen in the past 6 months Yes    How many times? 1   couple days ago with playing with kids   Has the patient had a decrease in activity level because of a fear of falling?  No    Is the patient reluctant to leave their home because of a fear of falling?  No      Home Ec[employer] residence    Living Arrangements Spouse/significant other    Type of [place] to enter    Entrance Stairs-Number of Steps 3    [place] One level      Prior Function   Level of Independence Independent    Vocation Part time employment     Vocation Requirements [employer], 3 hours a day 3 days a week      Cognition   Overall Cognitive Status Within Functional Limits for tasks assessed      Observation/Other Assessments   Observations pt in no apparent distress, forward head and rounded shoulders    Focus on Therapeutic Outcomes (FOTO)  no foto-MCD      Sensation   Light Touch Appears Intact      Coordination   Gross Motor Movements are Fluid and Coordinated Yes      ROM / Strength   AROM / PROM / Strength Strength;AROM;PROM      AROM   AROM Assessment Site Lumbar    Right/Left Hip --    Right/Left Knee --    Right/Left Ankle --    Right Ankle Inversion 25    Right Ankle Eversion 30    Left Ankle Inversion 25    Left Ankle Eversion 30    Lumbar Flexion just above ankles    Lumbar Extension painful, WNL bending    Lumbar - Right Side Bend slightly past joint line    Lumbar - Left Side Bend to joint line    Lumbar - Right Rotation painful WNL    Lumbar - Left Rotation WNL      PROM   PROM Assessment Site Hip    Right/Left Hip Right;Left    Right Hip Flexion 56   during supine hamstring stretch   Left Hip Flexion 60   during supine hamstring stretch     Strength   Strength Assessment Site Hip;Knee;Ankle    Right/Left Hip Right;Left    Right Hip Flexion 4/5    Right Hip Extension 3+/5    Right Hip ABduction 4-/5    Left Hip Flexion 5/5    Left Hip Extension 3+/5    Left Hip ABduction 4+/5    Right/Left Knee Right;Left    Right Knee Flexion 5/5    Right Knee Extension 4/5    Left Knee Flexion 5/5    Left Knee Extension 5/5    Right/Left Ankle Right;Left    Right Ankle Dorsiflexion 4+/5    Right Ankle Plantar Flexion --  12/20 with decreased height   Right Ankle Inversion 5/5    Right Ankle Eversion 4/5    Left Ankle Dorsiflexion 5/5    Left Ankle Plantar Flexion --   20 but decreased height not full ROM   Left Ankle Inversion 5/5    Left Ankle Eversion 5/5                                 Objective measurements completed on examination: See above findings.          [facility] Adult PT Treatment/Exercise - 12/18/20 0001       Ambulation/Gait   Gait Comments bilateral ER at hip, pes planus at feet, knee valgus and trendelenburg      Exercises   Exercises Knee/Hip      Knee/Hip Exercises: Stretches   Passive Hamstring Stretch 2 reps;30 seconds    Passive Hamstring Stretch Limitations with strap      Knee/Hip Exercises: Standing   Heel Raises 10 reps    Heel Raises Limitations performed 20 SL bilaterally during examination      Knee/Hip Exercises: Supine   Bridges 10 reps      Knee/Hip Exercises: Sidelying   Hip ABduction 10 reps;Both    Hip ABduction Limitations R>L difficutly; multimodal cueing needed                          PT Education - 12/18/20 1059     Education Details HEP, sx explanation, POC    Person(s) Educated Patient    Methods Explanation;Demonstration;Tactile cues;Verbal cues;Handout    Comprehension Verbalized understanding;Returned demonstration;Need further instruction              PT Short Term Goals - 12/18/20 1242       PT SHORT TERM GOAL #1   Title Pt will be independent with initial HEP    Baseline given on eval    Time 2    Period Weeks    Status New    Target Date 01/01/21      PT SHORT TERM GOAL #2   Title Pt will increase bilateral hip extension to 4/5 MMT in order to help with prolonged standing.    Baseline bilateral hip extension MMT 3+/5    Time 3    Period Weeks    Status New    Target Date 01/08/21      PT SHORT TERM GOAL #3   Title Pt will be able to increase standing and sitting tolerance to 10 minutes without an increase in pain in order to help with job duties and allow for rest.    Baseline sitting and standing 7 minutes    Time 3    Period Weeks    Status New    Target Date 01/08/21                PT Long Term Goals - 12/18/20 1242       PT LONG TERM GOAL #1   Title  Pt will be indepent with advanced HEP    Baseline not given    Time 8    Period Weeks    Status New    Target Date 02/12/21      PT LONG TERM GOAL #2   Title Pt will be demonstrate bilateral 5/5 MMT strength at the ankles in order to help with standing and walking.  Baseline R ankle calf raises 12/20 decreased height;  L ankle plantarflexion 20/20 decreased height not full ROM; R ankle dorsiflexion 4+/5, R ankle eversion 4/5,    Time 8    Period Weeks    Status New    Target Date 02/12/21      PT LONG TERM GOAL #3   Title Pt will achieve R hip MMT strength  and L hip extension MMT of >/= 4/5 MMT strength to help increase ability to sit, stand, and walk for prolonged periods of time    Baseline R hip extension 3+  R hip flexion 4/5  R hip abduction 4-/5, L hip extension 3+/5    Time 8    Period Weeks    Status New    Target Date 02/12/21      PT LONG TERM GOAL #4   Title Pt will be able to achieve R hip flexion of 80 degrees with passive hamstring stretch in order to help decrease LE pain.    Baseline R hip flexion 56 SLR passive hamstring stretch, L hip flexion 60 SLR passive hamstring stretch    Time 8    Period Weeks    Status New    Target Date 02/12/21      PT LONG TERM GOAL #5   Title Pt will be able to sit and stand for 30 minutes without increase in pain.    Baseline 7 minutes sitting and standing    Time 8    Period Weeks    Status New    Target Date 02/12/21                         Plan - 12/18/20 1238     Clinical Impression Statement Pt is a 20 y/o M presenting to OPPT with complaints of bilateral ankle, knee, hip, and LBP. Examination reveals R>L LE strength deficits at each joint, decrease hamstring length R>L, and difficulty maintaining static positions. Gait showed overpronation at bilateral feet, hip abductor weakness during stance, and possible weak IR's/tight ER's as feet were in slgiht ER. Shows possible instability of kneecap in bilateral  knees due to subjective report of the knee 'popping out of place' and causing pain, but due to time constraints was not further investigated. Given bilateral glut max/med strengthening and gastroc strengthening this session as well as passive hamstring stretch. Pt would benefit from skilled PT in order to address above impairements in order to help with work duties, LE stability, and static standing/sitting.    Personal Factors and Comorbidities Time since onset of injury/illness/exacerbation;Comorbidity 3+    Comorbidities CP, abnormal gait, anxiety, depression    Examination-Activity Limitations Bend;Lift;Stand;Stairs;Squat;Locomotion Level    Examination-Participation Restrictions Occupation;Yard Work;Volunteer;Shop;Cleaning    Stability/Clinical Decision Making Stable/Uncomplicated    Clinical Decision Making Low    Rehab Potential Good    PT Frequency 1x / week    PT Duration 8 weeks    PT Treatment/Interventions ADLs/Self Care Home Management;Electrical Stimulation;Iontophoresis 42m/ml Dexamethasone;Spinal Manipulations;Joint Manipulations;Dry needling;Passive range of motion;Manual techniques;Therapeutic exercise;Therapeutic activities;Functional mobility training;Stair training;Gait training;Taping;Patient/family education    PT Next Visit Plan knee stability, hip extension, abduction strengthing, hamstring stretches, R eversion strengthening, plantarflexion strengthening    PT Home Exercise Plan [email or web address]    Consulted and Agree with Plan of Care Patient             Patient will benefit from skilled therapeutic intervention in order to improve the following deficits and impairments:  Pain,Postural dysfunction,Decreased strength,Decreased mobility,Improper body mechanics,Abnormal gait,Decreased range of motion  Visit Diagnosis: Low back pain, unspecified back pain laterality, unspecified chronicity, unspecified whether sciatica present  Pain in right hip  Pain in left hip  Chronic  pain of left knee  Chronic pain of right knee  Pain in left ankle and joints of left foot  Pain in right ankle and joints of right foot  Muscle weakness (generalized)  Other abnormalities of gait and mobility      Problem List Patient Active Problem List   Diagnosis Date Noted  . Amblyopia, right eye 08/19/2016  . Morbid obesity ([place]) 03/27/2015  . Episodic tension-type headache, not intractable 03/26/2015  . Problems with learning 03/26/2015  . Migraine without aura 12/01/2013  . 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 Lack of normal physiological development, unspecified 12/01/2013  . 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 Attention deficit disorder 12/01/2013  . 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

## 2020-12-23 ENCOUNTER — Other Ambulatory Visit: Payer: Self-pay

## 2020-12-23 ENCOUNTER — Telehealth (HOSPITAL_COMMUNITY): Payer: Medicaid Other

## 2021-01-01 ENCOUNTER — Ambulatory Visit: Payer: Medicaid Other | Admitting: Physical Therapy

## 2021-01-08 ENCOUNTER — Ambulatory Visit: Payer: Medicaid Other | Attending: Family Medicine | Admitting: Physical Therapy

## 2021-01-09 ENCOUNTER — Encounter (INDEPENDENT_AMBULATORY_CARE_PROVIDER_SITE_OTHER): Payer: Self-pay

## 2021-01-15 ENCOUNTER — Ambulatory Visit: Payer: Medicaid Other | Admitting: Physical Therapy

## 2021-01-16 ENCOUNTER — Telehealth: Payer: Self-pay | Admitting: Physical Therapy

## 2021-01-16 NOTE — Telephone Encounter (Signed)
Attempted to contact patient regarding missed PT appointment. No answer and unable to leave VM.  Rosana Hoes, PT, DPT, LAT, ATC 01/16/21  11:37 AM Phone: [phone] Fax: [phone]

## 2021-01-21 ENCOUNTER — Encounter: Payer: Medicaid Other | Admitting: Physical Therapy

## 2021-01-22 ENCOUNTER — Ambulatory Visit: Payer: Medicaid Other | Admitting: Physical Therapy

## 2021-01-27 ENCOUNTER — Ambulatory Visit: Payer: Medicaid Other | Admitting: Physical Therapy

## 2021-01-27 ENCOUNTER — Telehealth: Payer: Self-pay | Admitting: Physical Therapy

## 2021-01-27 NOTE — Telephone Encounter (Signed)
Called pt in regards to missed appointment; no answer. Unable to leave voicemail due to full inbox.  Due to multiple missed appointments and cancels, PT to discharge patient per attendance policy. Patient will need to get a new MD order if he wishes to pursue therapy.  Raevin Wierenga April Dell Ponto, PT, DPT

## 2021-02-05 ENCOUNTER — Ambulatory Visit (HOSPITAL_COMMUNITY): Payer: No Typology Code available for payment source | Admitting: Licensed Clinical Social Worker

## 2021-02-05 ENCOUNTER — Telehealth (HOSPITAL_COMMUNITY): Payer: Self-pay | Admitting: Licensed Clinical Social Worker

## 2021-02-05 NOTE — Telephone Encounter (Signed)
LCSW sent two links to pt phone at 11 and 1105 with no response. LCSW then f/u with PC, no VM was left as VM box was not set up. LCSW stayed in text link for video message until 11:12 before disconnecting

## 2021-03-03 ENCOUNTER — Ambulatory Visit: Payer: Self-pay | Admitting: Nurse Practitioner
